# Patient Record
Sex: Male | Born: 2000
Health system: Southern US, Community
[De-identification: ages and names within clinical notes are randomized; demographics above are authoritative.]

## PROBLEM LIST (undated history)

## (undated) DIAGNOSIS — T7840XA Allergy, unspecified, initial encounter: Secondary | ICD-10-CM

## (undated) HISTORY — DX: Allergy, unspecified, initial encounter: T78.40XA

---
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
## 2022-09-17 DIAGNOSIS — R5383 Other fatigue: Secondary | ICD-10-CM | POA: Diagnosis not present

## 2022-09-17 DIAGNOSIS — Z1152 Encounter for screening for COVID-19: Secondary | ICD-10-CM | POA: Diagnosis not present

## 2022-09-17 DIAGNOSIS — J22 Unspecified acute lower respiratory infection: Secondary | ICD-10-CM | POA: Diagnosis not present

## 2022-09-17 DIAGNOSIS — R0981 Nasal congestion: Secondary | ICD-10-CM | POA: Diagnosis not present

## 2023-01-29 DIAGNOSIS — D509 Iron deficiency anemia, unspecified: Secondary | ICD-10-CM | POA: Diagnosis not present

## 2023-01-29 DIAGNOSIS — R7989 Other specified abnormal findings of blood chemistry: Secondary | ICD-10-CM | POA: Diagnosis not present

## 2023-03-23 ENCOUNTER — Encounter (HOSPITAL_COMMUNITY): Payer: Self-pay

## 2023-03-23 ENCOUNTER — Other Ambulatory Visit: Payer: Self-pay

## 2023-03-23 ENCOUNTER — Emergency Department (HOSPITAL_COMMUNITY)
Admission: EM | Admit: 2023-03-23 | Discharge: 2023-03-24 | Disposition: A | Discharge status: Home / Self Care | Payer: BLUE CROSS/BLUE SHIELD | Attending: Emergency Medicine | Admitting: Emergency Medicine

## 2023-03-23 DIAGNOSIS — R1084 Generalized abdominal pain: Secondary | ICD-10-CM | POA: Insufficient documentation

## 2023-03-23 DIAGNOSIS — E860 Dehydration: Secondary | ICD-10-CM | POA: Insufficient documentation

## 2023-03-23 DIAGNOSIS — R197 Diarrhea, unspecified: Secondary | ICD-10-CM | POA: Insufficient documentation

## 2023-03-23 DIAGNOSIS — R112 Nausea with vomiting, unspecified: Secondary | ICD-10-CM | POA: Insufficient documentation

## 2023-03-23 LAB — URINALYSIS, ROUTINE W REFLEX MICROSCOPIC
Bacteria, UA: NONE SEEN
Bilirubin Urine: NEGATIVE
Glucose, UA: NEGATIVE mg/dL
Hgb urine dipstick: NEGATIVE
Ketones, ur: NEGATIVE mg/dL
Leukocytes,Ua: NEGATIVE
Nitrite: NEGATIVE
Protein, ur: 100 mg/dL — AB
Specific Gravity, Urine: 1.029 (ref 1.005–1.030)
pH: 9 — ABNORMAL HIGH (ref 5.0–8.0)

## 2023-03-23 LAB — COMPREHENSIVE METABOLIC PANEL
ALT: 10 U/L (ref 0–44)
AST: 28 U/L (ref 15–41)
Albumin: 4.6 g/dL (ref 3.5–5.0)
Alkaline Phosphatase: 61 U/L (ref 38–126)
Anion gap: 12 (ref 5–15)
BUN: 10 mg/dL (ref 6–20)
CO2: 19 mmol/L — ABNORMAL LOW (ref 22–32)
Calcium: 9.3 mg/dL (ref 8.9–10.3)
Chloride: 105 mmol/L (ref 98–111)
Creatinine, Ser: 0.99 mg/dL (ref 0.61–1.24)
GFR, Estimated: >60 mL/min (ref >60)
Glucose, Bld: 166 mg/dL — ABNORMAL HIGH (ref 70–99)
Potassium: 3.1 mmol/L — ABNORMAL LOW (ref 3.5–5.1)
Sodium: 136 mmol/L (ref 135–145)
Total Bilirubin: 0.7 mg/dL (ref 0.3–1.2)
Total Protein: 7.6 g/dL (ref 6.5–8.1)

## 2023-03-23 LAB — CBC
HCT: 45.4 % (ref 39.0–52.0)
Hemoglobin: 14.4 g/dL (ref 13.0–17.0)
MCH: 25.9 pg — ABNORMAL LOW (ref 26.0–34.0)
MCHC: 31.7 g/dL (ref 30.0–36.0)
MCV: 81.5 fL (ref 80.0–100.0)
Platelets: 335 10*3/uL (ref 150–400)
RBC: 5.57 MIL/uL (ref 4.22–5.81)
RDW: 13.1 % (ref 11.5–15.5)
WBC: 7.5 10*3/uL (ref 4.0–10.5)
nRBC: 0 % (ref 0.0–0.2)

## 2023-03-23 LAB — LIPASE, BLOOD: Lipase: 27 U/L (ref 11–51)

## 2023-03-23 MED ORDER — ACETAMINOPHEN 325 MG PO TABS
650.0000 mg | ORAL_TABLET | Freq: Once | ORAL | Status: AC
  Administered 2023-03-23: 650 mg via ORAL
  Filled 2023-03-23: qty 2

## 2023-03-23 MED ORDER — ONDANSETRON 4 MG PO TBDP
4.0000 mg | ORAL_TABLET | Freq: Once | ORAL | Status: AC
  Administered 2023-03-23: 4 mg via ORAL
  Filled 2023-03-23: qty 1

## 2023-03-23 MED ORDER — ONDANSETRON 4 MG PO TBDP
4.0000 mg | ORAL_TABLET | Freq: Once | ORAL | Status: AC | PRN
  Administered 2023-03-23: 4 mg via ORAL
  Filled 2023-03-23: qty 1

## 2023-03-23 NOTE — ED Triage Notes (Signed)
Mid abdominal pain that started today at 1000. Pt has been vomiting all day, nauseous on arrival. A few episodes of diarrhea. Pt still has appendix and gallbladder.

## 2023-03-24 MED ORDER — ONDANSETRON 8 MG PO TBDP: 8.0000 mg | ORAL_TABLET | Freq: Three times a day (TID) | ORAL | 0 refills | Status: AC | PRN

## 2023-03-24 NOTE — ED Provider Notes (Signed)
[place] EMERGENCY DEPARTMENT AT [facility] Provider Note   CSN: [id number] Arrival date & time: 03/23/23  2029     History  Chief Complaint  Patient presents with   Abdominal Pain    Jorge Carr is a 22 y.o. male.  The history is provided by the patient.  Patient is an otherwise healthy 22 year old who presents with abdominal pain vomiting and diarrhea.  Patient reports that started earlier in the day with mid abdominal pain and nonbloody emesis.  He has had a few episodes of nonbloody diarrhea.  No fevers.  No cough.  No previous abdominal surgeries He has had some vomiting here in the ER while awaiting evaluation     Home Medications Prior to Admission medications   Medication Sig Start Date End Date Taking? Authorizing Provider  ondansetron (ZOFRAN-ODT) 8 MG disintegrating tablet Take 1 tablet (8 mg total) by mouth every 8 (eight) hours as needed. 8mg  ODT q4 hours prn nausea 03/24/23  Yes Zadie Rhine, MD      Allergies    Penicillins    Review of Systems   Review of Systems  Respiratory:  Negative for cough.   Gastrointestinal:  Positive for abdominal pain, diarrhea, nausea and vomiting. Negative for blood in stool.    Physical Exam Updated Vital Signs BP 126/81 (BP Location: Left Arm)   Pulse 60   Resp 17   Ht 1.676 m (5\' 6" )   Wt 49.9 kg   SpO2 100%   BMI 17.75 kg/m  Physical Exam CONSTITUTIONAL: Well developed/well nourished HEAD: Normocephalic/atraumatic EYES: EOMI/PERRL, no icterus ENMT: Mucous membranes dry NECK: supple no meningeal signs CV: S1/S2 noted, no murmurs/rubs/gallops noted LUNGS: Lungs are clear to auscultation bilaterally, no apparent distress ABDOMEN: soft, mild diffuse tenderness no rebound or guarding, bowel sounds noted throughout abdomen NEURO: Pt is awake/alert/appropriate, moves all extremitiesx4.  No facial droop.   EXTREMITIES: pulses normal/equal, full ROM SKIN: warm, color normal PSYCH: no  abnormalities of mood noted, alert and oriented to situation  ED Results / Procedures / Treatments   Labs (all labs ordered are listed, but only abnormal results are displayed) Labs Reviewed  COMPREHENSIVE METABOLIC PANEL - Abnormal; Notable for the following components:      Result Value   Potassium 3.1 (*)    CO2 19 (*)    Glucose, Bld 166 (*)    All other components within normal limits  CBC - Abnormal; Notable for the following components:   MCH 25.9 (*)    All other components within normal limits  URINALYSIS, ROUTINE W REFLEX MICROSCOPIC - Abnormal; Notable for the following components:   pH 9.0 (*)    Protein, ur 100 (*)    All other components within normal limits  LIPASE, BLOOD    EKG None  Radiology No results found.  Procedures Procedures    Medications Ordered in ED Medications  ondansetron (ZOFRAN-ODT) disintegrating tablet 4 mg (4 mg Oral Given 03/23/23 2042)  ondansetron (ZOFRAN-ODT) disintegrating tablet 4 mg (4 mg Oral Given 03/23/23 2322)  acetaminophen (TYLENOL) tablet 650 mg (650 mg Oral Given 03/23/23 2322)    ED Course/ Medical Decision Making/ A&P Clinical Course as of 03/24/23 0040  Tue Mar 24, 2023  0039 Patient feeling improved.  He is taking oral fluids.  He has the urge to have diarrhea but has not had any further episodes.  No vomiting. On repeat exam there is no focal abdominal tenderness.  Suspect underlying gastroenteritis [DW]  0039 Patient is  appropriate for d/c home.  I doubt acute abdominal emergency at this time.  We discussed strict ER return precautions including abdominal pain that migrates to RLQ, fever >100.28F with repetitive vomiting over next 8-12 hours [DW]    Clinical Course User Index [DW] Zadie Rhine, MD                                 Medical Decision Making Amount and/or Complexity of Data Reviewed Labs: ordered.  Risk OTC drugs. Prescription drug management.   This patient presents to the ED for  concern of abdominal pain, this involves an extensive number of treatment options, and is a complaint that carries with it a high risk of complications and morbidity.  The differential diagnosis includes but is not limited to cholecystitis, cholelithiasis, pancreatitis, gastritis, peptic ulcer disease, appendicitis, bowel obstruction, bowel perforation, diverticulitis, AAA, UTI    Social Determinants of Health: Patient's impaired access to primary care  increases the complexity of managing their presentation  Additional history obtained: Additional history obtained from family   Lab Tests: I Ordered, and personally interpreted labs.  The pertinent results include: deHydration noted  Medicines ordered and prescription drug management: I ordered medication including Zofran for nausea Reevaluation of the patient after these medicines showed that the patient    improved  Test Considered: I considered CT abdomen pelvis, patient is improving, will defer for now   Reevaluation: After the interventions noted above, I reevaluated the patient and found that they have :improved  Complexity of problems addressed: Patient's presentation is most consistent with  acute presentation with potential threat to life or bodily function  Disposition: After consideration of the diagnostic results and the patient's response to treatment,  I feel that the patent would benefit from discharge   .           Final Clinical Impression(s) / ED Diagnoses Final diagnoses:  Nausea vomiting and diarrhea    Rx / DC Orders ED Discharge Orders          Ordered    ondansetron (ZOFRAN-ODT) 8 MG disintegrating tablet  Every 8 hours PRN        03/24/23 0034              Zadie Rhine, MD 03/24/23 0040

## 2023-03-24 NOTE — Discharge Instructions (Signed)
